# Patient Record
Sex: Male | Born: 2000
Health system: Southern US, Community
[De-identification: ages and names within clinical notes are randomized; demographics above are authoritative.]

## PROBLEM LIST (undated history)

## (undated) HISTORY — PX: CLEFT LIP REPAIR: SUR1164

---
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
## 2020-08-09 ENCOUNTER — Encounter (INDEPENDENT_AMBULATORY_CARE_PROVIDER_SITE_OTHER): Payer: Self-pay | Admitting: Student in an Organized Health Care Education/Training Program

## 2021-11-11 IMAGING — DX DG CHEST 2V
2 series · 2 of 2 positions shown · non-contrast
Comparison: September 27, 2015.

CLINICAL DATA: Chest pain.

EXAM:
CHEST - 2 VIEW

[chest pa]
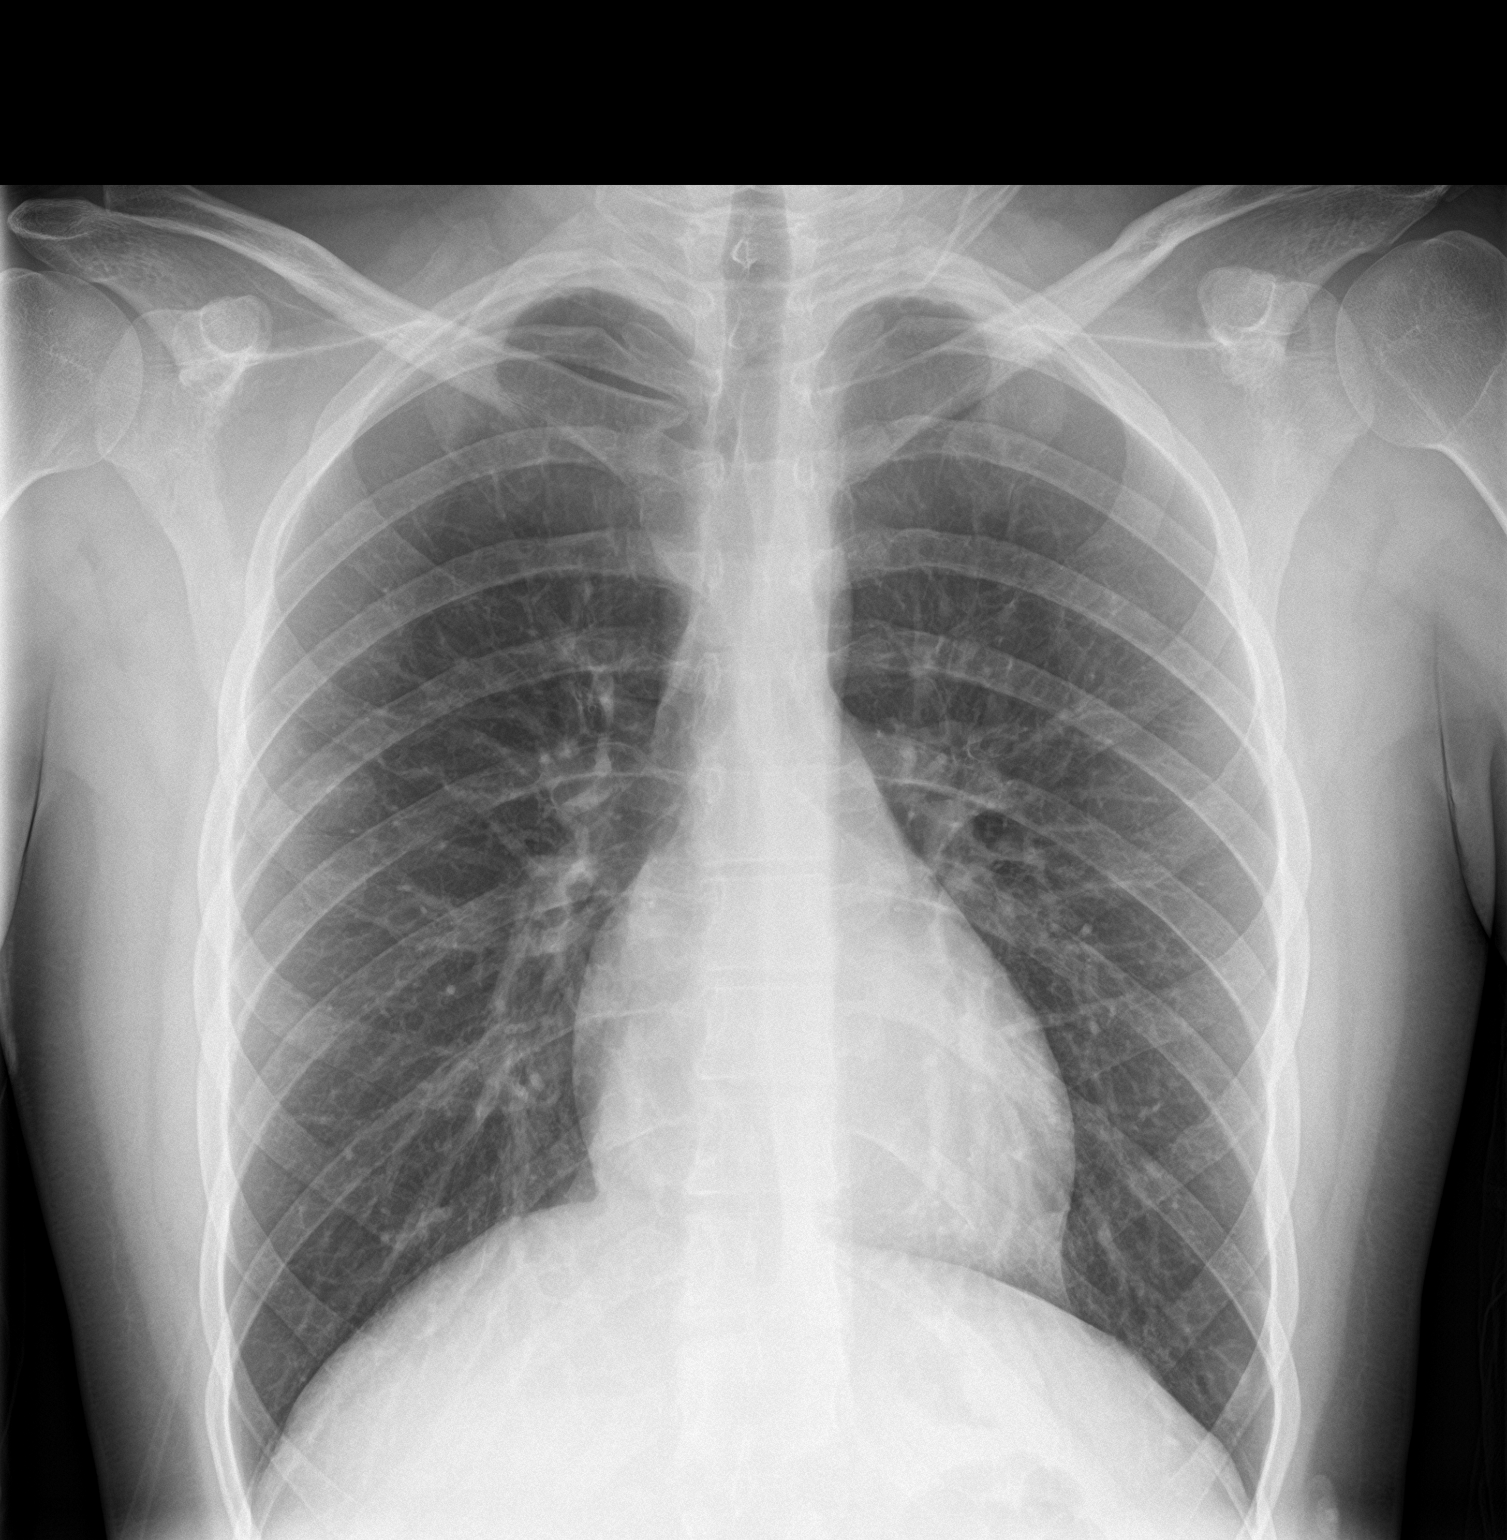

[chest lat]
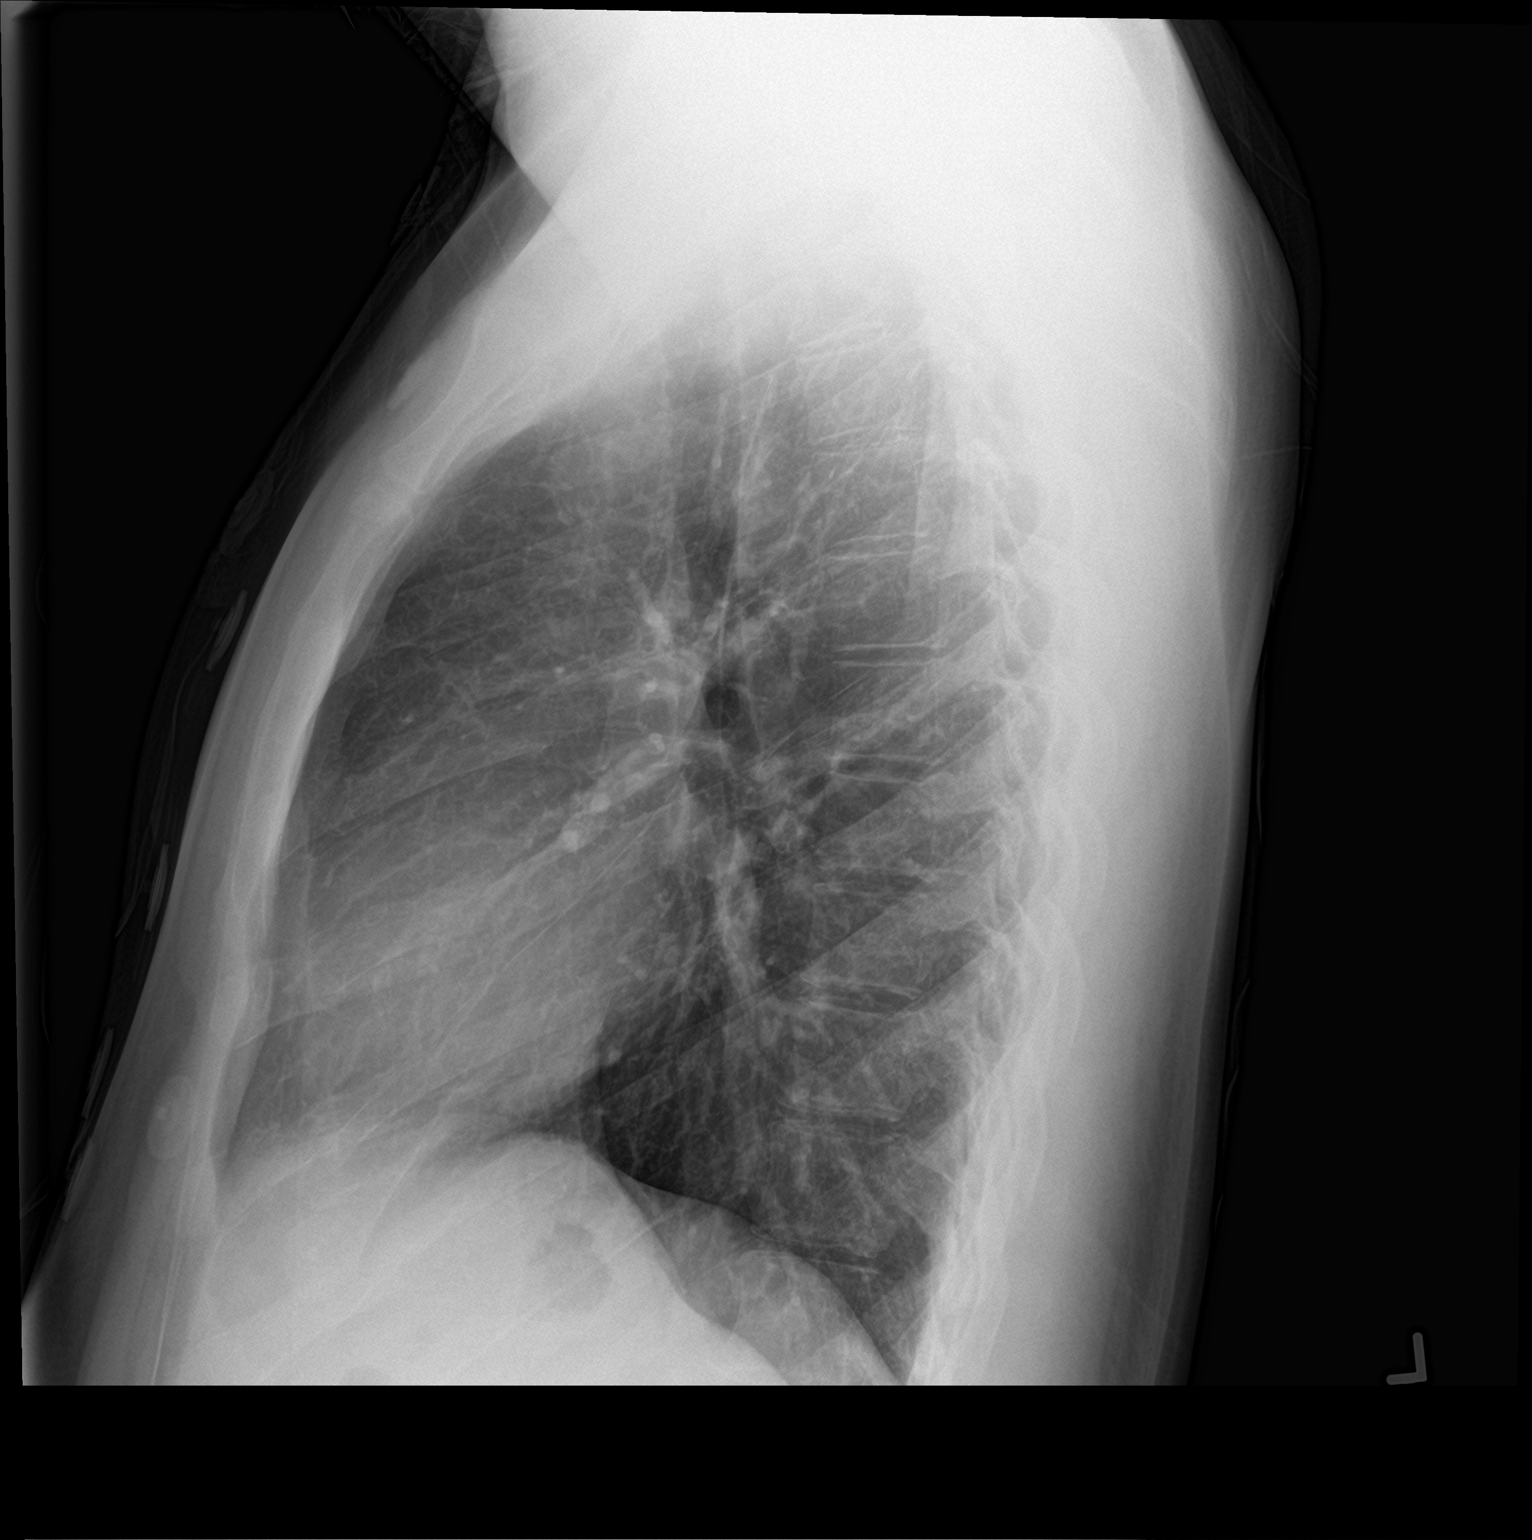

[2 of 2 positions shown; findings below may reference images not displayed]

FINDINGS: The heart size and mediastinal contours are within normal limits.
Both lungs are clear. No pneumothorax or pleural effusion is noted.
The visualized skeletal structures are unremarkable.
IMPRESSION: No active cardiopulmonary disease.
